# Patient Record
Sex: Male | Born: 1980 | Race: Black or African American | Hispanic: No | Marital: Married | Smoking: Never smoker
Health system: Southern US, Community
[De-identification: ages and names within clinical notes are randomized; demographics above are authoritative.]

## PROBLEM LIST (undated history)

## (undated) DIAGNOSIS — R55 Syncope and collapse: Secondary | ICD-10-CM

## (undated) HISTORY — DX: Syncope and collapse: R55

## (undated) HISTORY — PX: OTHER SURGICAL HISTORY: SHX169

## (undated) HISTORY — PX: INGUINAL HERNIA REPAIR: SUR1180

---

## 2001-05-31 ENCOUNTER — Emergency Department (HOSPITAL_COMMUNITY): Admission: EM | Admit: 2001-05-31 | Discharge: 2001-06-01 | Payer: Self-pay | Admitting: *Deleted

## 2002-03-23 ENCOUNTER — Ambulatory Visit (HOSPITAL_COMMUNITY): Admission: RE | Admit: 2002-03-23 | Discharge: 2002-03-23 | Payer: Self-pay | Admitting: Family Medicine

## 2002-03-23 ENCOUNTER — Encounter: Payer: Self-pay | Admitting: Family Medicine

## 2002-05-27 ENCOUNTER — Emergency Department (HOSPITAL_COMMUNITY): Admission: EM | Admit: 2002-05-27 | Discharge: 2002-05-27 | Payer: Self-pay | Admitting: Emergency Medicine

## 2002-05-28 ENCOUNTER — Encounter: Payer: Self-pay | Admitting: Emergency Medicine

## 2002-05-28 ENCOUNTER — Ambulatory Visit (HOSPITAL_COMMUNITY): Admission: RE | Admit: 2002-05-28 | Discharge: 2002-05-28 | Payer: Self-pay | Admitting: Emergency Medicine

## 2002-06-09 ENCOUNTER — Emergency Department (HOSPITAL_COMMUNITY): Admission: EM | Admit: 2002-06-09 | Discharge: 2002-06-10 | Payer: Self-pay | Admitting: Emergency Medicine

## 2002-06-09 ENCOUNTER — Encounter: Payer: Self-pay | Admitting: Emergency Medicine

## 2006-03-21 ENCOUNTER — Encounter: Admission: RE | Admit: 2006-03-21 | Discharge: 2006-03-21 | Payer: Self-pay | Admitting: Family Medicine

## 2006-10-23 ENCOUNTER — Ambulatory Visit: Payer: Self-pay | Admitting: Internal Medicine

## 2006-10-23 LAB — CONVERTED CEMR LAB
ALT: 13 units/L (ref 0–40)
AST: 20 units/L (ref 0–37)
Albumin: 4.7 g/dL (ref 3.5–5.2)
Alkaline Phosphatase: 44 units/L (ref 39–117)
Angiotensin 1 Converting Enzyme: 32 units/L (ref 9–67)
BUN: 14 mg/dL (ref 6–23)
Basophils Absolute: 0 10*3/uL (ref 0.0–0.1)
Basophils Relative: 0.2 % (ref 0.0–1.0)
Bilirubin, Direct: 0.1 mg/dL (ref 0.0–0.3)
CO2: 33 meq/L — ABNORMAL HIGH (ref 19–32)
Calcium: 9.7 mg/dL (ref 8.4–10.5)
Chloride: 105 meq/L (ref 96–112)
Creatinine, Ser: 0.9 mg/dL (ref 0.4–1.5)
Eosinophils Absolute: 0.1 10*3/uL (ref 0.0–0.6)
Eosinophils Relative: 0.8 % (ref 0.0–5.0)
GFR calc Af Amer: 132 mL/min
GFR calc non Af Amer: 109 mL/min
Glucose, Bld: 102 mg/dL — ABNORMAL HIGH (ref 70–99)
HCT: 43.7 % (ref 39.0–52.0)
Hemoglobin: 14.6 g/dL (ref 13.0–17.0)
Lymphocytes Relative: 13.9 % (ref 12.0–46.0)
MCHC: 33.3 g/dL (ref 30.0–36.0)
MCV: 81.8 fL (ref 78.0–100.0)
Monocytes Absolute: 0.3 10*3/uL (ref 0.2–0.7)
Monocytes Relative: 4.9 % (ref 3.0–11.0)
Neutro Abs: 5.1 10*3/uL (ref 1.4–7.7)
Neutrophils Relative %: 80.2 % — ABNORMAL HIGH (ref 43.0–77.0)
Platelets: 174 10*3/uL (ref 150–400)
Potassium: 4.4 meq/L (ref 3.5–5.1)
RBC: 5.34 M/uL (ref 4.22–5.81)
RDW: 12.7 % (ref 11.5–14.6)
Sed Rate: 5 mm/hr (ref 0–20)
Sodium: 142 meq/L (ref 135–145)
TSH: 1.46 microintl units/mL (ref 0.35–5.50)
Total Bilirubin: 0.9 mg/dL (ref 0.3–1.2)
Total Protein: 7.8 g/dL (ref 6.0–8.3)
WBC: 6.4 10*3/uL (ref 4.5–10.5)

## 2007-02-12 ENCOUNTER — Encounter: Admission: RE | Admit: 2007-02-12 | Discharge: 2007-02-12 | Payer: Self-pay | Admitting: Gastroenterology

## 2008-02-22 ENCOUNTER — Emergency Department: Payer: Self-pay | Admitting: Emergency Medicine

## 2008-07-28 ENCOUNTER — Ambulatory Visit: Payer: Self-pay | Admitting: Gastroenterology

## 2008-08-05 ENCOUNTER — Ambulatory Visit (HOSPITAL_COMMUNITY): Admission: RE | Admit: 2008-08-05 | Discharge: 2008-08-05 | Payer: Self-pay | Admitting: Gastroenterology

## 2008-08-05 ENCOUNTER — Ambulatory Visit: Payer: Self-pay | Admitting: Gastroenterology

## 2008-08-05 ENCOUNTER — Encounter: Payer: Self-pay | Admitting: Gastroenterology

## 2008-08-24 ENCOUNTER — Encounter: Payer: Self-pay | Admitting: Gastroenterology

## 2010-10-24 NOTE — Assessment & Plan Note (Signed)
[facility]                             PULMONARY OFFICE NOTE   NAME:[name], [name]                       MRN:          [id number]  DATE:10/23/2006                            DOB:          1980-09-16    PULMONARY NEW PATIENT EVALUATION   HISTORY:  A 30 year old black male, never smoker, with a cough on-and-  off for about five years with no real change in pattern, no real  triggering factors except for the fact that it seems worse when he is  exposed to cold air, whether from an air conditioner or when he walks  out in the wintertime. It is dry in nature. Not typically preventing him  from sleeping or present when he wakes up in the morning. He denies any  associated dyspnea, subjective wheeze, overt sinus or reflux symptoms.  He has been tried with a allergy medicines in the past, which he says  did no good.   PAST MEDICAL HISTORY:  Significant for the absence of history of allergy  or respiratory diseases. He has never had any major operations except  for hernias when he was born.  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   MEDICATIONS:  He is taking Valtrex.   ALLERGIES:  None known.   SOCIAL HISTORY:  He has never smoked. He denies any alcohol use or  unusual travel, pet or hobby exposure. He works as a Engineer, technical sales.  The cough is no worse at work.   FAMILY HISTORY:  Taken in detail and significant for the absence of  atopy or respiratory disease.   REVIEW OF SYSTEMS:  Taken in detail on the worksheet and negative except  as outlined above. Significant for chronic sweats at night without any  fever or unintended weight loss.   PHYSICAL EXAMINATION:  This is a slightly anxious, but pleasant,  ambulatory, black male with frequent throat clearing. Otherwise, he  looks very healthy, but quite thin (denies any recent weight loss).  HEENT: Is unremarkable. Turbinates normal. Oropharynx is clear.  Dentition is intact. Ear canals are  clear bilaterally.  NECK: Supple without cervical adenopathy or tenderness. Trachea is  midline without tenderness.  LUNGS: Lung fields are perfectly clear bilaterally to auscultation and  percussion with cough early on inspiratory maneuvers.  HEART: Regular rate and rhythm without murmur, gallop or rub.  ABDOMEN: Soft, benign.  EXTREMITIES: Warm without calf tenderness, cyanosis, clubbing or edema.   Hemoglobin saturation is 100% on room air.   Chest x-ray is normal.   Lab studies are pending.   IMPRESSION:  Impression is chronic cough more day than night with  failure to respond to medicines directed at allergies (unclear what  this was however). More that occur early on inspiration are typical of  gastroesophageal reflux disease, although he does not have the typical  risk factors. I thought he might have sarcoid until I reviewed his chest  x-ray and found that his chest x-ray was normal (95% of patient's with  sarcoid have an abnormal chest x-ray). I have recommended therefore the  following approach:  1. First, I educated him regarding the nature of cyclical cough      including throat clearing. I asked him not to use mint or menthol      lozenges and empirically started him on Prevacid 30 mg taken 30      minutes before breakfast every day for the next four weeks and then      return here for followup. To suppress the cough, I have recommended      Delsym 2 teaspoons every 12 hours.  2. I did recommend an ACE level, CBC with differential, sed rate, TSH,      BMET and hepatic profile looking for any evidence of occult      sarcoid, although I do not believe this applies here.     Charlaine Dalton. Sherene Sires, MD, [facility]     MBW/MedQ  DD: 10/23/2006  DT: 10/23/2006  Job #: [id number]   cc:   Lorin Picket A. Gerda Diss, MD

## 2010-10-24 NOTE — Op Note (Signed)
[name], [name]                ACCOUNT NO.:  [id number]   MEDICAL RECORD NO.:  [id number]          PATIENT TYPE:  AMB   LOCATION:  DAY                           FACILITY:  [facility]   PHYSICIAN:  Kassie Mends, M.D.      DATE OF BIRTH:  [birth year]   DATE OF PROCEDURE:  08/05/2008  DATE OF DISCHARGE:                               OPERATIVE REPORT   <?REFERRING Mimie Goering/>  Lilyan Punt, M.D.   PROCEDURE:  Ileocolonoscopy   INDICATIONS:  30 yo male with adbominal pain and rectal bleeding.   FINDINGS:  1. A nodular appearance to the lining of the mucosa beginning in the      sigmoid colon and extending to the cecum.  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      Otherwise, no evidence of polyps, masses, or AVMs seen.  No      diverticula.  2. Small internal hemorrhoids as well as normal retroflexion view of      the rectum.  3. Normal terminal ileum, approximately 10 cm visualized.   DIAGNOSES:  1. Small internal hemorrhoids likely the source for rectal bleeding.  2. No source for abdominal pain identified.   RECOMMENDATIONS:  1. Add Cipro 500 mg twice daily for 21 days.  2. Will call Mr. Jump with results of his biopsies.  If this pain      persists after antibiotic treatments then a CT scan of the abdomen      and pelvis will be obtained.  3. He should follow a high-fiber diet.  He is given a handout on high-      fiber diet and hemorrhoids.  4. He already has a followup appointment to see me.  5. No aspirin, NSAIDs, or anticoagulation for 5 days.   MEDICATIONS:  1. Demerol 75 mg IV.  2. Versed 5 mg IV.   PROCEDURE TECHNIQUE:  Physical exam was performed.  Consent was obtained  from the patient after explaining the benefits, risks, and alternatives  to the procedure.  The patient connected to monitor and placed in left  lateral position.  Continuous oxygen was provided by nasal cannula and  IV medicine administered through  an indwelling cannula.  After  administration of sedation and rectal exam, the patient's rectum was  intubated.  The scope was advanced under direct visualization to the  distal terminal ileum.  The scope was removed slowly by carefully  examining the color, texture, anatomy, and integrity of the mucosa on  the way out.  The patient was recovered in endoscopy and discharged home  in satisfactory condition.   PATH:  Benign with melanosis coli      Kassie Mends, M.D.  Electronically Signed     SM/MEDQ  D:  08/05/2008  T:  08/05/2008  Job:  [id number]   cc:   Lorin Picket A. Gerda Diss, MD  Fax: [phone]

## 2010-10-24 NOTE — Consult Note (Signed)
[name], [name]                ACCOUNT NO.:  [id number]   MEDICAL RECORD NO.:  [id number]           PATIENT TYPE:  AMB   LOCATION:  DAY                           FACILITY:  [facility]   PHYSICIAN:  Kassie Mends, M.D.      DATE OF BIRTH:  Oct 09, 1980   DATE OF CONSULTATION:  07/28/2008  DATE OF DISCHARGE:                                 CONSULTATION   PRIMARY PHYSICIAN:  Scott A. Gerda Diss, MD   REASON FOR CONSULTATION:  Lower abdominal pain.   HISTORY OF PRESENT ILLNESS:  Mr. [name] is a 30 year old male who has had  problems with lower abdominal discomfort for 6 years.  He says it is  worse now.  It is more frequent.  He describes it as a burning sensation  that he feels primarily at nighttime.  He was treated with reflux  medications, but it did not help.  He also reports having an ultrasound.  Upon review of the electronic medical records, he had upper GI series  performed in September 2008.  His esophageal peristalsis was normal.  The stomach and the duodenum were normal, and the scout film showed an  unremarkable bowel gas pattern.  The barium tablet passed through the  esophagus without obstruction.  Also complains that sometimes he has  white sores in his mouth.  He resumed denies having a CT scan or  endoscopy.  The burning sensation does not keep him awake at tonight,  but he is very aware of it.  It is located in his lower quadrant and  periumbilical.  He denies any weight loss or diarrhea.  He saw blood in  his stool approximately 1 month ago.  He saw it twice.  He reports  having a prostate exam by Dr. Gerda Diss and it was okay.  He denies any  constipation, dysuria, or hematuria.  He is up at night to urinate 2  times a night.  He feels like he empties his bladder.  He does not have  any pain in his testicles.  Nothing makes the pain worse and there are  no precipitating factors.  He uses only Tylenol.  He denies using  aspirins, BC, Goody Powder, Aleve, or ibuprofen.   PAST  MEDICAL HISTORY:  Genital herpes.   PAST SURGICAL HISTORY:  Hernia repair at birth.   ALLERGIES:  No known drug allergies.   MEDICATIONS:  Acyclovir.   FAMILY HISTORY:  His grandfather had colon cancer at age less than 32.  He has no family history of polyps.  He had an aunt with breast cancer.  No one in his family has had ulcerative colitis or Crohn disease.   SOCIAL HISTORY:  He is married for 7 years.  He has no children.  He  works in Audiological scientist.  He denies any tobacco or alcohol use.   REVIEW OF SYSTEMS:  Per the HPI, otherwise all systems are negative.   PHYSICAL EXAMINATION:  VITAL SIGNS:  Weight 135 pounds, height 5 feet 10  inches, BMI 19.4 (underweight) temperature 98.7, blood pressure 120/90,  pulse 72.  GENERAL:  He is in no apparent distress, alert and oriented x4.HEENT:  Atraumatic, normocephalic.  Pupils equal and reactive to light.  Mouth,  no oral lesions.  Posterior pharynx without erythema or exudate.NECK:  Full range of motion.  No lymphadenopathy.LUNGS:  Clear to auscultation  bilaterally.CARDIOVASCULAR:  Regular rhythm.  No murmur.  Normal S1 and  S2.ABDOMEN:  Bowel sounds are present and soft, nontender, nondistended.  No rebound or guarding.  No hepatosplenomegaly or abdominal  bruits.EXTREMITIES:  No cyanosis or edema.SKIN:  No pretibial  lesions.NEUROLOGIC:  He has no focal neurologic deficits.   ASSESSMENT:  Mr. [name] is a 30 year old male with rectal bleeding and  abdominal pain.  The differential diagnosis includes a low likelihood of  inflammatory bowel disease.  The rectal bleeding is likely due to benign  anorectal source such as hemorrhoids.  The differential diagnosis  includes colorectal cancer or polyp.  His lower abdominal pain may be  due to functional abdominal pain.  The differential diagnosis includes  chronic prostatitis, and low a likelihood of occult malignancy.   RECOMMENDATIONS:  1. Will schedule colonoscopy next week with a  MoviPrep.  If his      colonoscopy does not reveal an etiology for his lower abdominal      pain, will empirically treat him for chronic prostatitis.  He will      also have a CT scan of the abdomen and pelvis performed.  2. He has a follow up appointment to see me in 2-3 months.      Kassie Mends, M.D.  Electronically Signed     SM/MEDQ  D:  07/28/2008  T:  07/29/2008  Job:  [id number]   cc:   Lorin Picket A. Gerda Diss, MD  Fax: [phone]

## 2013-06-01 ENCOUNTER — Institutional Professional Consult (permissible substitution): Payer: Self-pay | Admitting: Interventional Cardiology

## 2013-06-02 ENCOUNTER — Encounter: Payer: Self-pay | Admitting: Neurology

## 2013-06-05 ENCOUNTER — Ambulatory Visit (INDEPENDENT_AMBULATORY_CARE_PROVIDER_SITE_OTHER): Payer: 59 | Admitting: Neurology

## 2013-06-05 ENCOUNTER — Encounter: Payer: Self-pay | Admitting: Neurology

## 2013-06-05 ENCOUNTER — Encounter (INDEPENDENT_AMBULATORY_CARE_PROVIDER_SITE_OTHER): Payer: Self-pay

## 2013-06-05 VITALS — BP 118/76 | HR 74 | Ht 70.0 in | Wt 134.5 lb

## 2013-06-05 DIAGNOSIS — R55 Syncope and collapse: Secondary | ICD-10-CM

## 2013-06-05 HISTORY — DX: Syncope and collapse: R55
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
## 2013-06-05 NOTE — Patient Instructions (Signed)
Syncope  Syncope is a fainting spell. This means the person loses consciousness and drops to the ground. The person is generally unconscious for less than 5 minutes. The person may have some muscle twitches for up to 15 seconds before waking up and returning to normal. Syncope occurs more often in elderly people, but it can happen to anyone. While most causes of syncope are not dangerous, syncope can be a sign of a serious medical problem. It is important to seek medical care.   CAUSES   Syncope is caused by a sudden decrease in blood flow to the brain. The specific cause is often not determined. Factors that can trigger syncope include:   Taking medicines that lower blood pressure.   Sudden changes in posture, such as standing up suddenly.   Taking more medicine than prescribed.   Standing in one place for too long.   Seizure disorders.   Dehydration and excessive exposure to heat.   Low blood sugar (hypoglycemia).   Straining to have a bowel movement.   Heart disease, irregular heartbeat, or other circulatory problems.   Fear, emotional distress, seeing blood, or severe pain.  SYMPTOMS   Right before fainting, you may:   Feel dizzy or lightheaded.   Feel nauseous.   See all white or all black in your field of vision.   Have cold, clammy skin.  DIAGNOSIS   Your caregiver will ask about your symptoms, perform a physical exam, and perform electrocardiography (ECG) to record the electrical activity of your heart. Your caregiver may also perform other heart or blood tests to determine the cause of your syncope.  TREATMENT   In most cases, no treatment is needed. Depending on the cause of your syncope, your caregiver may recommend changing or stopping some of your medicines.  HOME CARE INSTRUCTIONS   Have someone stay with you until you feel stable.   Do not drive, operate machinery, or play sports until your caregiver says it is okay.   Keep all follow-up appointments as directed by your  caregiver.   Lie down right away if you start feeling like you might faint. Breathe deeply and steadily. Wait until all the symptoms have passed.   Drink enough fluids to keep your urine clear or pale yellow.   If you are taking blood pressure or heart medicine, get up slowly, taking several minutes to sit and then stand. This can reduce dizziness.  SEEK IMMEDIATE MEDICAL CARE IF:    You have a severe headache.   You have unusual pain in the chest, abdomen, or back.   You are bleeding from the mouth or rectum, or you have black or tarry stool.   You have an irregular or very fast heartbeat.   You have pain with breathing.   You have repeated fainting or seizure-like jerking during an episode.   You faint when sitting or lying down.   You have confusion.   You have difficulty walking.   You have severe weakness.   You have vision problems.  If you fainted, call your local emergency services (911 in [place] Do not drive yourself to the hospital.   MAKE SURE YOU:   Understand these instructions.   Will watch your condition.   Will get help right away if you are not doing well or get worse.  Document Released: 05/28/2005 Document Revised: 11/27/2011 Document Reviewed: 07/27/2011  [employer] Patient Information 2014 [employer]

## 2013-06-09 ENCOUNTER — Other Ambulatory Visit: Payer: 59

## 2013-06-09 ENCOUNTER — Other Ambulatory Visit: Payer: 59 | Admitting: Radiology

## 2013-06-12 ENCOUNTER — Encounter: Payer: Self-pay | Admitting: Radiology

## 2013-06-12 ENCOUNTER — Encounter (INDEPENDENT_AMBULATORY_CARE_PROVIDER_SITE_OTHER): Payer: 59

## 2013-06-12 DIAGNOSIS — R55 Syncope and collapse: Secondary | ICD-10-CM

## 2013-06-12 NOTE — Progress Notes (Signed)
Patient ID: Derrick RuddleMichael B Crawford, male   DOB: 03/06/1981, 33 y.o.   MRN: [id number] 21 day E cardio monitor applied. (lifwatch unavailable)

## 2013-06-16 ENCOUNTER — Institutional Professional Consult (permissible substitution): Payer: Self-pay | Admitting: Interventional Cardiology

## 2013-07-02 ENCOUNTER — Encounter: Payer: Self-pay | Admitting: Interventional Cardiology

## 2013-07-03 ENCOUNTER — Inpatient Hospital Stay: Admission: RE | Admit: 2013-07-03 | Payer: 59 | Source: Ambulatory Visit

## 2016-10-02 DIAGNOSIS — R194 Change in bowel habit: Secondary | ICD-10-CM | POA: Diagnosis not present

## 2016-10-02 DIAGNOSIS — R1033 Periumbilical pain: Secondary | ICD-10-CM | POA: Diagnosis not present

## 2016-10-02 DIAGNOSIS — R110 Nausea: Secondary | ICD-10-CM | POA: Diagnosis not present

## 2016-10-05 DIAGNOSIS — K2930 Chronic superficial gastritis without bleeding: Secondary | ICD-10-CM | POA: Diagnosis not present

## 2016-10-05 DIAGNOSIS — R1033 Periumbilical pain: Secondary | ICD-10-CM | POA: Diagnosis not present

## 2016-10-05 DIAGNOSIS — R1084 Generalized abdominal pain: Secondary | ICD-10-CM | POA: Diagnosis not present

## 2016-10-12 DIAGNOSIS — K589 Irritable bowel syndrome without diarrhea: Secondary | ICD-10-CM | POA: Diagnosis not present

## 2017-03-25 DIAGNOSIS — J069 Acute upper respiratory infection, unspecified: Secondary | ICD-10-CM | POA: Diagnosis not present

## 2017-03-25 DIAGNOSIS — J029 Acute pharyngitis, unspecified: Secondary | ICD-10-CM | POA: Diagnosis not present

## 2017-03-25 DIAGNOSIS — Z9289 Personal history of other medical treatment: Secondary | ICD-10-CM | POA: Diagnosis not present

## 2017-04-05 DIAGNOSIS — R369 Urethral discharge, unspecified: Secondary | ICD-10-CM | POA: Diagnosis not present

## 2017-05-17 DIAGNOSIS — Z0000 Encounter for general adult medical examination without abnormal findings: Secondary | ICD-10-CM | POA: Diagnosis not present

## 2017-05-17 DIAGNOSIS — Z13220 Encounter for screening for lipoid disorders: Secondary | ICD-10-CM | POA: Diagnosis not present

## 2017-05-17 DIAGNOSIS — Z79899 Other long term (current) drug therapy: Secondary | ICD-10-CM | POA: Diagnosis not present

## 2017-07-09 DIAGNOSIS — S63591A Other specified sprain of right wrist, initial encounter: Secondary | ICD-10-CM | POA: Diagnosis not present

## 2017-08-30 ENCOUNTER — Encounter: Payer: Self-pay | Admitting: Emergency Medicine

## 2017-08-30 ENCOUNTER — Emergency Department: Payer: 59

## 2017-08-30 ENCOUNTER — Emergency Department
Admission: EM | Admit: 2017-08-30 | Discharge: 2017-08-30 | Disposition: A | Discharge status: Home / Self Care | Payer: 59 | Attending: Emergency Medicine | Admitting: Emergency Medicine

## 2017-08-30 DIAGNOSIS — R079 Chest pain, unspecified: Secondary | ICD-10-CM | POA: Diagnosis not present

## 2017-08-30 DIAGNOSIS — Z79899 Other long term (current) drug therapy: Secondary | ICD-10-CM | POA: Diagnosis not present

## 2017-08-30 DIAGNOSIS — R002 Palpitations: Secondary | ICD-10-CM | POA: Diagnosis not present

## 2017-08-30 LAB — BASIC METABOLIC PANEL
ANION GAP: 9 (ref 5–15)
BUN: 17 mg/dL (ref 6–20)
CHLORIDE: 102 mmol/L (ref 101–111)
CO2: 26 mmol/L (ref 22–32)
Calcium: 9.2 mg/dL (ref 8.9–10.3)
Creatinine, Ser: 0.98 mg/dL (ref 0.61–1.24)
GFR calc non Af Amer: >60 mL/min (ref >60)
Glucose, Bld: 98 mg/dL (ref 65–99)
Potassium: 3.9 mmol/L (ref 3.5–5.1)
Sodium: 137 mmol/L (ref 135–145)

## 2017-08-30 LAB — CBC
HEMATOCRIT: 48.1 % (ref 40.0–52.0)
HEMOGLOBIN: 15.5 g/dL (ref 13.0–18.0)
MCH: 27.1 pg (ref 26.0–34.0)
MCHC: 32.2 g/dL (ref 32.0–36.0)
MCV: 84.1 fL (ref 80.0–100.0)
Platelets: 217 10*3/uL (ref 150–440)
RBC: 5.71 MIL/uL (ref 4.40–5.90)
RDW: 14 % (ref 11.5–14.5)
WBC: 5.2 10*3/uL (ref 3.8–10.6)

## 2017-08-30 LAB — TROPONIN I
Troponin I: <0.03 ng/mL (ref <0.03)
Troponin I: <0.03 ng/mL (ref <0.03)
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
## 2017-08-30 NOTE — ED Triage Notes (Signed)
Patient presents to ED via POV from home with c/o central CP x 1 week. Patient reports it got worse last night. Patient report 1 episode of palpitations. Denies nausea or vomiting. A&O x4. Ambulatory to triage.

## 2017-08-30 NOTE — Discharge Instructions (Addendum)
The lab work and the other tests we have done today do not show any problems. Just to be safe I would like you to follow up with cardiology. Dr. Juliann Paresallwood is on-call today. Please give his office a phone call. At them know you're in the emergency room with chest discomfort. They will be able to see you probably by Monday or Tuesday. Please return for any further symptoms.

## 2017-09-05 DIAGNOSIS — R0602 Shortness of breath: Secondary | ICD-10-CM | POA: Diagnosis not present

## 2017-09-05 DIAGNOSIS — R011 Cardiac murmur, unspecified: Secondary | ICD-10-CM | POA: Diagnosis not present

## 2017-09-05 DIAGNOSIS — J309 Allergic rhinitis, unspecified: Secondary | ICD-10-CM | POA: Diagnosis not present

## 2017-11-04 ENCOUNTER — Emergency Department (HOSPITAL_COMMUNITY): Payer: 59

## 2017-11-04 ENCOUNTER — Emergency Department (HOSPITAL_COMMUNITY)
Admission: EM | Admit: 2017-11-04 | Discharge: 2017-11-04 | Disposition: A | Discharge status: Home / Self Care | Payer: 59 | Attending: Emergency Medicine | Admitting: Emergency Medicine

## 2017-11-04 ENCOUNTER — Encounter (HOSPITAL_COMMUNITY): Payer: Self-pay | Admitting: Emergency Medicine

## 2017-11-04 DIAGNOSIS — R109 Unspecified abdominal pain: Secondary | ICD-10-CM | POA: Diagnosis not present

## 2017-11-04 DIAGNOSIS — Z79899 Other long term (current) drug therapy: Secondary | ICD-10-CM | POA: Insufficient documentation

## 2017-11-04 DIAGNOSIS — K529 Noninfective gastroenteritis and colitis, unspecified: Secondary | ICD-10-CM

## 2017-11-04 DIAGNOSIS — A09 Infectious gastroenteritis and colitis, unspecified: Secondary | ICD-10-CM | POA: Diagnosis not present

## 2017-11-04 DIAGNOSIS — R197 Diarrhea, unspecified: Secondary | ICD-10-CM

## 2017-11-04 LAB — CBC
HEMATOCRIT: 44.3 % (ref 39.0–52.0)
Hemoglobin: 14.6 g/dL (ref 13.0–17.0)
MCH: 27.2 pg (ref 26.0–34.0)
MCHC: 33 g/dL (ref 30.0–36.0)
MCV: 82.5 fL (ref 78.0–100.0)
Platelets: 171 10*3/uL (ref 150–400)
RBC: 5.37 MIL/uL (ref 4.22–5.81)
RDW: 13 % (ref 11.5–15.5)
WBC: 7.4 10*3/uL (ref 4.0–10.5)

## 2017-11-04 LAB — COMPREHENSIVE METABOLIC PANEL
ALBUMIN: 3.6 g/dL (ref 3.5–5.0)
ALT: 23 U/L (ref 17–63)
AST: 29 U/L (ref 15–41)
Alkaline Phosphatase: 37 U/L — ABNORMAL LOW (ref 38–126)
Anion gap: 9 (ref 5–15)
BILIRUBIN TOTAL: 0.9 mg/dL (ref 0.3–1.2)
BUN: 9 mg/dL (ref 6–20)
CHLORIDE: 100 mmol/L — AB (ref 101–111)
CO2: 25 mmol/L (ref 22–32)
Calcium: 8.8 mg/dL — ABNORMAL LOW (ref 8.9–10.3)
Creatinine, Ser: 1.21 mg/dL (ref 0.61–1.24)
GFR calc Af Amer: >60 mL/min (ref >60)
GFR calc non Af Amer: >60 mL/min (ref >60)
GLUCOSE: 126 mg/dL — AB (ref 65–99)
POTASSIUM: 3.7 mmol/L (ref 3.5–5.1)
Sodium: 134 mmol/L — ABNORMAL LOW (ref 135–145)
Total Protein: 6.8 g/dL (ref 6.5–8.1)

## 2017-11-04 LAB — URINALYSIS, ROUTINE W REFLEX MICROSCOPIC
Bacteria, UA: NONE SEEN
Bilirubin Urine: NEGATIVE
GLUCOSE, UA: NEGATIVE mg/dL
Ketones, ur: NEGATIVE mg/dL
Leukocytes, UA: NEGATIVE
Nitrite: NEGATIVE
Protein, ur: NEGATIVE mg/dL
SPECIFIC GRAVITY, URINE: 1.006 (ref 1.005–1.030)
pH: 6 (ref 5.0–8.0)

## 2017-11-04 LAB — LIPASE, BLOOD: Lipase: 21 U/L (ref 11–51)

## 2017-11-04 MED ORDER — METRONIDAZOLE 500 MG PO TABS: 500.0000 mg | ORAL_TABLET | Freq: Three times a day (TID) | ORAL | 0 refills | Status: AC

## 2017-11-04 MED ORDER — IOHEXOL 300 MG/ML  SOLN
100.0000 mL | Freq: Once | INTRAMUSCULAR | Status: AC | PRN
  Administered 2017-11-04: 100 mL via INTRAVENOUS

## 2017-11-04 MED ORDER — CIPROFLOXACIN HCL 500 MG PO TABS: 500.0000 mg | ORAL_TABLET | Freq: Two times a day (BID) | ORAL | 0 refills | Status: AC

## 2017-11-04 MED ORDER — SODIUM CHLORIDE 0.9 % IV BOLUS
1000.0000 mL | Freq: Once | INTRAVENOUS | Status: AC
  Administered 2017-11-04: 1000 mL via INTRAVENOUS
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
## 2017-11-04 NOTE — ED Notes (Signed)
Pt discharged from ED; instructions provided and scripts given; Pt encouraged to return to ED if symptoms worsen and to f/u with PCP; Pt verbalized understanding of all instructions 

## 2017-11-04 NOTE — ED Notes (Signed)
Patient reports using the restroom and having a watery bowel movement with "a lot of blood". RN notified.

## 2017-11-04 NOTE — ED Triage Notes (Signed)
Pt presents with c/o multiple episodes of diarrhea in last 24 hours, pt reports seeing small amount bright red spots in stool. +fever of 101 at home, +chills, +HA, body aches.  Denies cough, denies urinary s/s

## 2017-11-04 NOTE — Discharge Instructions (Signed)
Take both of the antibiotics as prescribed until all gone.  Take Tylenol or Motrin for pain.  Drink plenty of fluids.  Rest.  Follow-up with gastroenterology if symptoms are not improving.  Return if worsening.

## 2017-11-04 NOTE — ED Provider Notes (Signed)
The pt is a 37 y/o male - OTW healthy, has no hx of GI problems - has had 2 days of diarrhea, has had some BRBPR with the diarrhea and some LLQ pain - has had fever to 101 at home - no rectal FB's, no travel or recent suspicious food ingestions  On exam, he has focal mild to moderate ttp over the LLQ - no guarding, no other abd ttp - otherwise, in no distress  Has likely colitis - presumed infections given fever and bloody stools, consider campylobacter, E coli, Shigella, Salmonella,  Cipro, flagyl, CT neg for other acute findings,  Pt informed of plan and stable for d/c.  Medical screening examination/treatment/procedure(s) were conducted as a shared visit with non-physician practitioner(s) and myself.  I personally evaluated the patient during the encounter.  Clinical Impression:   Final diagnoses:  Colitis  Diarrhea of presumed infectious origin         Derrick Hong, MD 11/05/17 1110

## 2017-11-14 DIAGNOSIS — Z131 Encounter for screening for diabetes mellitus: Secondary | ICD-10-CM | POA: Diagnosis not present

## 2017-11-14 DIAGNOSIS — Z13220 Encounter for screening for lipoid disorders: Secondary | ICD-10-CM | POA: Diagnosis not present

## 2017-11-14 DIAGNOSIS — K529 Noninfective gastroenteritis and colitis, unspecified: Secondary | ICD-10-CM | POA: Diagnosis not present

## 2017-12-27 DIAGNOSIS — H40013 Open angle with borderline findings, low risk, bilateral: Secondary | ICD-10-CM | POA: Diagnosis not present

## 2018-05-13 DIAGNOSIS — K529 Noninfective gastroenteritis and colitis, unspecified: Secondary | ICD-10-CM | POA: Diagnosis not present

## 2018-05-20 DIAGNOSIS — Z13220 Encounter for screening for lipoid disorders: Secondary | ICD-10-CM | POA: Diagnosis not present

## 2018-05-20 DIAGNOSIS — Z0000 Encounter for general adult medical examination without abnormal findings: Secondary | ICD-10-CM | POA: Diagnosis not present

## 2018-05-20 DIAGNOSIS — Z79899 Other long term (current) drug therapy: Secondary | ICD-10-CM | POA: Diagnosis not present

## 2019-04-21 ENCOUNTER — Other Ambulatory Visit: Payer: Self-pay

## 2019-04-21 DIAGNOSIS — Z20822 Contact with and (suspected) exposure to covid-19: Secondary | ICD-10-CM

## 2019-04-23 LAB — NOVEL CORONAVIRUS, NAA: SARS-CoV-2, NAA: NOT DETECTED

## 2019-04-24 ENCOUNTER — Telehealth: Payer: Self-pay | Admitting: Family Medicine

## 2019-04-24 NOTE — Telephone Encounter (Signed)
Negative COVID results given. Patient results "NOT Detected." Caller expressed understanding. ° °

## 2019-10-01 ENCOUNTER — Other Ambulatory Visit: Payer: Self-pay

## 2019-10-01 ENCOUNTER — Encounter (HOSPITAL_COMMUNITY): Payer: Self-pay

## 2019-10-01 ENCOUNTER — Emergency Department (HOSPITAL_COMMUNITY)
Admission: EM | Admit: 2019-10-01 | Discharge: 2019-10-01 | Disposition: A | Discharge status: Home / Self Care | Payer: 59 | Attending: Emergency Medicine | Admitting: Emergency Medicine

## 2019-10-01 DIAGNOSIS — Z041 Encounter for examination and observation following transport accident: Secondary | ICD-10-CM | POA: Diagnosis not present

## 2019-10-01 DIAGNOSIS — R52 Pain, unspecified: Secondary | ICD-10-CM | POA: Diagnosis present

## 2019-10-01 DIAGNOSIS — Z79899 Other long term (current) drug therapy: Secondary | ICD-10-CM | POA: Diagnosis not present

## 2019-10-01 MED ORDER — CYCLOBENZAPRINE HCL 10 MG PO TABS: 10.0000 mg | ORAL_TABLET | Freq: Two times a day (BID) | ORAL | 0 refills | Status: DC | PRN

## 2019-10-01 MED ORDER — IBUPROFEN 600 MG PO TABS: 600.0000 mg | ORAL_TABLET | Freq: Four times a day (QID) | ORAL | 0 refills | Status: DC | PRN

## 2019-10-01 MED ORDER — CYCLOBENZAPRINE HCL 10 MG PO TABS: 10.0000 mg | ORAL_TABLET | Freq: Two times a day (BID) | ORAL | 0 refills | Status: AC | PRN
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
## 2019-10-01 NOTE — ED Notes (Signed)
Patient verbalizes understanding of discharge instructions. Opportunity for questioning and answers were provided. Armband removed by staff, pt discharged from ED.  
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
## 2019-11-23 ENCOUNTER — Emergency Department (HOSPITAL_COMMUNITY)
Admission: EM | Admit: 2019-11-23 | Discharge: 2019-11-24 | Disposition: A | Discharge status: Home / Self Care | Payer: 59 | Attending: Emergency Medicine | Admitting: Emergency Medicine

## 2019-11-23 ENCOUNTER — Other Ambulatory Visit: Payer: Self-pay

## 2019-11-23 ENCOUNTER — Encounter (HOSPITAL_COMMUNITY): Payer: Self-pay

## 2019-11-23 DIAGNOSIS — Y999 Unspecified external cause status: Secondary | ICD-10-CM | POA: Diagnosis not present

## 2019-11-23 DIAGNOSIS — Y93H2 Activity, gardening and landscaping: Secondary | ICD-10-CM | POA: Diagnosis not present

## 2019-11-23 DIAGNOSIS — Y9289 Other specified places as the place of occurrence of the external cause: Secondary | ICD-10-CM | POA: Diagnosis not present

## 2019-11-23 DIAGNOSIS — X58XXXA Exposure to other specified factors, initial encounter: Secondary | ICD-10-CM | POA: Insufficient documentation

## 2019-11-23 DIAGNOSIS — H579 Unspecified disorder of eye and adnexa: Secondary | ICD-10-CM

## 2019-11-23 DIAGNOSIS — R55 Syncope and collapse: Secondary | ICD-10-CM | POA: Diagnosis not present

## 2019-11-23 DIAGNOSIS — H5789 Other specified disorders of eye and adnexa: Secondary | ICD-10-CM | POA: Diagnosis present

## 2019-11-23 DIAGNOSIS — S0502XA Injury of conjunctiva and corneal abrasion without foreign body, left eye, initial encounter: Secondary | ICD-10-CM | POA: Diagnosis not present

## 2019-11-23 MED ORDER — FLUORESCEIN SODIUM 1 MG OP STRP
1.0000 | ORAL_STRIP | Freq: Once | OPHTHALMIC | Status: AC
  Administered 2019-11-24: 1 via OPHTHALMIC
  Filled 2019-11-23: qty 1

## 2019-11-23 MED ORDER — TETRACAINE HCL 0.5 % OP SOLN
2.0000 [drp] | Freq: Once | OPHTHALMIC | Status: AC
  Administered 2019-11-24: 2 [drp] via OPHTHALMIC
  Filled 2019-11-23: qty 4
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
## 2019-11-23 NOTE — ED Triage Notes (Signed)
Patient arrived stating Saturday he was cutting the grass and got something in his left eye, states he has flushed it with no relief. Denies any changes in vision. Patient still wearing his contacts due to pain when removing them.

## 2019-11-24 MED ORDER — IBUPROFEN 600 MG PO TABS: 600.0000 mg | ORAL_TABLET | Freq: Four times a day (QID) | ORAL | 0 refills | Status: AC | PRN

## 2019-11-24 MED ORDER — IBUPROFEN 800 MG PO TABS
800.0000 mg | ORAL_TABLET | Freq: Once | ORAL | Status: AC
  Administered 2019-11-24: 800 mg via ORAL
  Filled 2019-11-24: qty 1

## 2019-11-24 MED ORDER — HYDROCODONE-ACETAMINOPHEN 5-325 MG PO TABS
1.0000 | ORAL_TABLET | Freq: Once | ORAL | Status: DC
  Filled 2019-11-24: qty 1

## 2019-11-24 MED ORDER — IBUPROFEN 600 MG PO TABS
600.0000 mg | ORAL_TABLET | Freq: Four times a day (QID) | ORAL | 0 refills | Status: DC | PRN
Start: 2019-11-24 — End: 2019-11-24

## 2019-11-24 MED ORDER — POLYMYXIN B-TRIMETHOPRIM 10000-0.1 UNIT/ML-% OP SOLN
1.0000 [drp] | OPHTHALMIC | 0 refills | Status: AC
Start: 2019-11-24 — End: 2019-12-01

## 2019-11-24 MED ORDER — POLYMYXIN B-TRIMETHOPRIM 10000-0.1 UNIT/ML-% OP SOLN
1.0000 [drp] | OPHTHALMIC | 0 refills | Status: DC
Start: 2019-11-24 — End: 2019-11-24

## 2019-11-24 NOTE — Discharge Instructions (Signed)
As we discussed you may have a small scratch in your cornea.  Take the antibiotics as prescribed.  There is no foreign body visible today but you should follow-up with the ophthalmologist for recheck tomorrow.  Return to the ED with worsening pain, changes in your vision, drainage, fever, any other concerns.

## 2019-12-10 ENCOUNTER — Ambulatory Visit: Payer: 59 | Attending: Internal Medicine

## 2019-12-10 DIAGNOSIS — Z23 Encounter for immunization: Secondary | ICD-10-CM

## 2019-12-10 NOTE — Progress Notes (Signed)
   Covid-19 Vaccination Clinic  Name:  [name]    MRN: [id number] DOB: [birth year]  12/10/2019  [name] was observed post Covid-19 immunization for 15 minutes without incident. He was provided with Vaccine Information Sheet and instruction to access the V-Safe system.   [name] was instructed to call 911 with any severe reactions post vaccine: Marland Kitchen Difficulty breathing  . Swelling of face and throat  . A fast heartbeat  . A bad rash all over body  . Dizziness and weakness   Immunizations Administered    Name Date Dose VIS Date Route   JANSSEN COVID-19 VACCINE 12/10/2019 11:51 AM 0.5 mL 08/08/2019 Intramuscular   Manufacturer: [employer]   Lot: [id number]   NDC: 44967-591-63

## 2020-01-14 IMAGING — CR DG CHEST 2V
1 series · 2 of 2 positions shown · non-contrast
Comparison: March 21, 2006

CLINICAL DATA: Chest pain

EXAM:
CHEST - 2 VIEW

[Series 1: dg chest 2 view · 0.14mm/px · 2 of 2 slices shown]
[im 1/2]
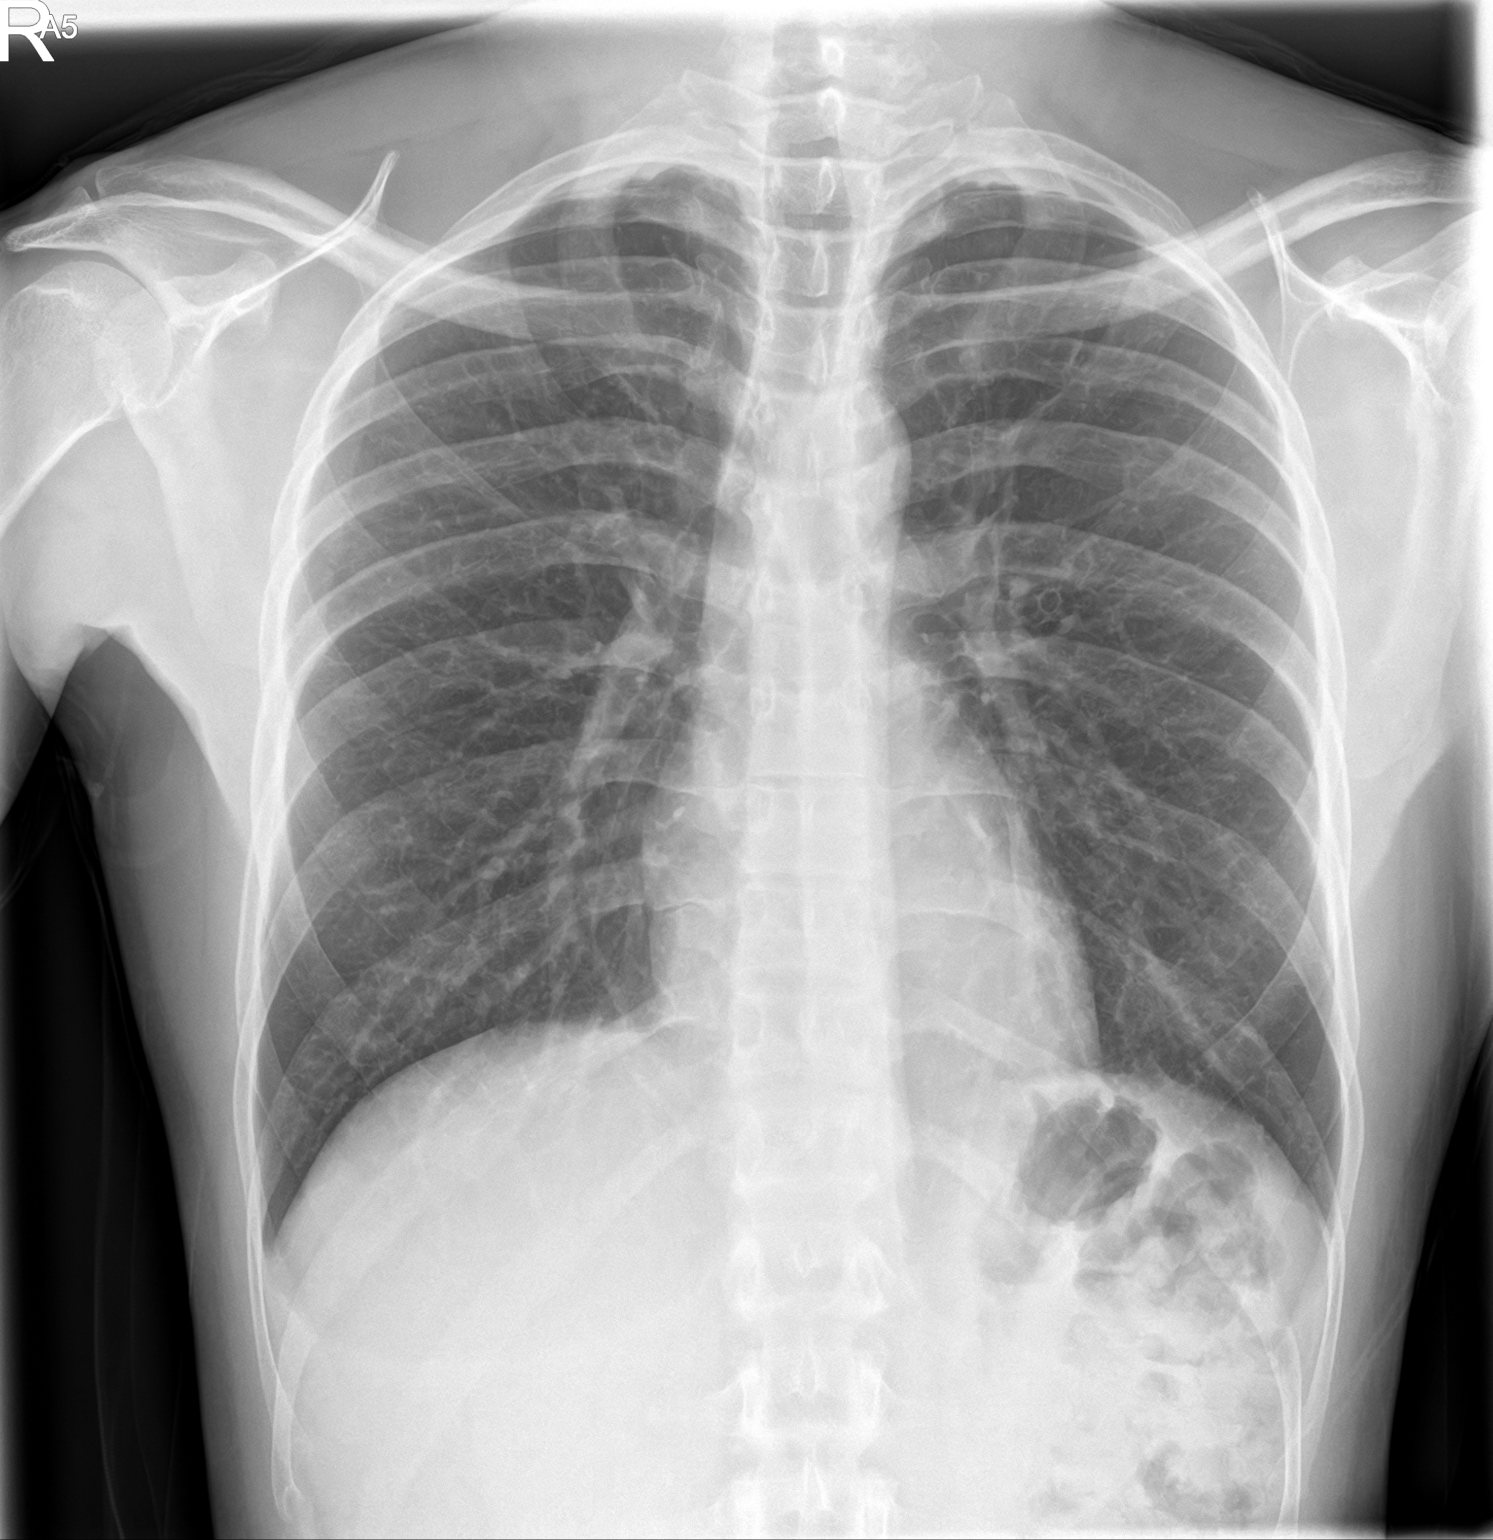
[im 2/2]
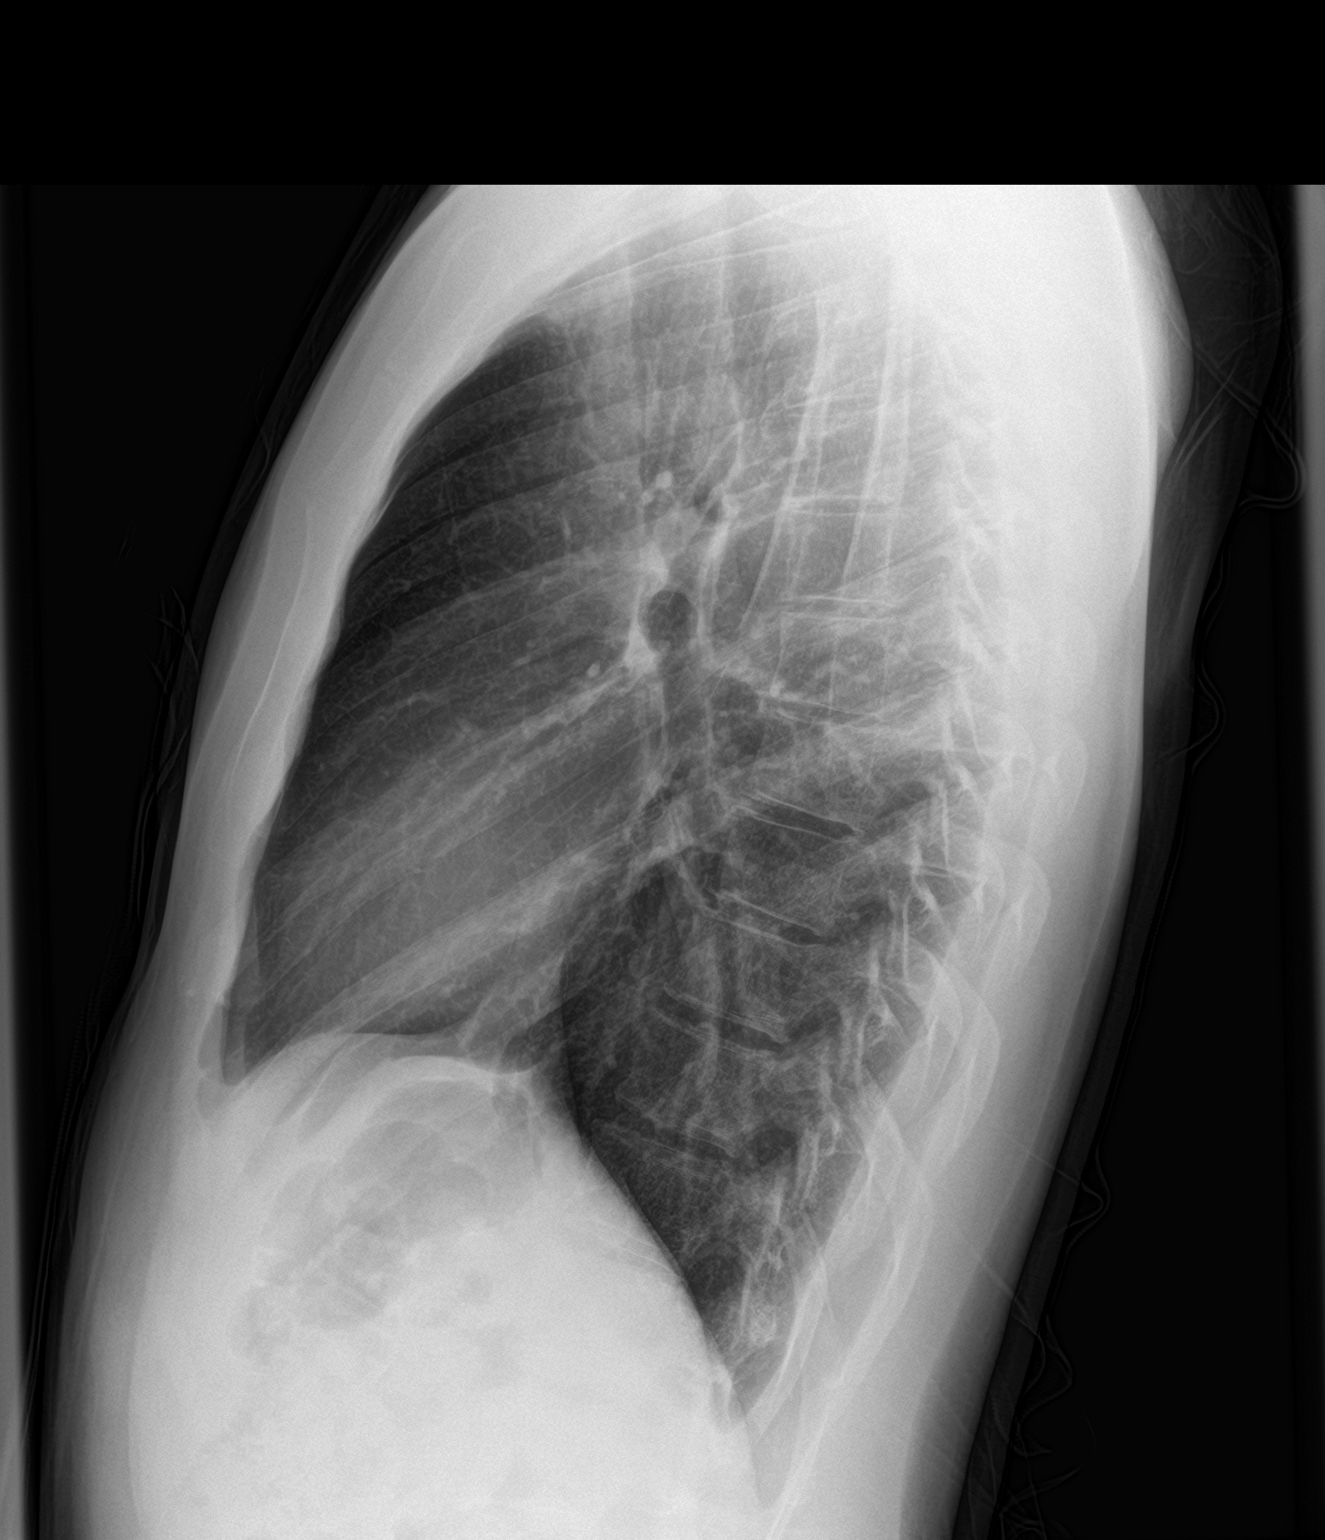

[2 of 2 positions shown; findings below may reference images not displayed]

FINDINGS: Lungs are clear. Heart size and pulmonary vascularity are normal. No
adenopathy. No pneumothorax. No bone lesions.
IMPRESSION: No edema or consolidation.

## 2022-05-25 ENCOUNTER — Telehealth: Payer: Self-pay | Admitting: *Deleted

## 2022-05-25 NOTE — Telephone Encounter (Signed)
error
